# Patient Record
Sex: Female | Born: 1935 | Marital: Married | Smoking: Never smoker
Health system: Southern US, Community
[De-identification: ages and names within clinical notes are randomized; demographics above are authoritative.]

## PROBLEM LIST (undated history)

## (undated) DIAGNOSIS — I10 Essential (primary) hypertension: Secondary | ICD-10-CM

## (undated) HISTORY — DX: Essential (primary) hypertension: I10

---

## 2014-04-24 ENCOUNTER — Ambulatory Visit (INDEPENDENT_AMBULATORY_CARE_PROVIDER_SITE_OTHER): Payer: Self-pay

## 2014-04-24 ENCOUNTER — Ambulatory Visit (INDEPENDENT_AMBULATORY_CARE_PROVIDER_SITE_OTHER): Payer: Self-pay | Admitting: Family Medicine

## 2014-04-24 VITALS — BP 176/92 | HR 95 | Temp 99.6°F | Resp 16 | Ht <= 58 in | Wt 113.0 lb

## 2014-04-24 DIAGNOSIS — I10 Essential (primary) hypertension: Secondary | ICD-10-CM | POA: Insufficient documentation

## 2014-04-24 DIAGNOSIS — R109 Unspecified abdominal pain: Secondary | ICD-10-CM

## 2014-04-24 DIAGNOSIS — Z789 Other specified health status: Secondary | ICD-10-CM

## 2014-04-24 LAB — POCT URINALYSIS DIPSTICK
Bilirubin, UA: NEGATIVE
GLUCOSE UA: NEGATIVE
Ketones, UA: 40
NITRITE UA: NEGATIVE
Protein, UA: 30
Spec Grav, UA: 1.015
UROBILINOGEN UA: 0.2
pH, UA: 5.5

## 2014-04-24 LAB — POCT UA - MICROSCOPIC ONLY
Casts, Ur, LPF, POC: NEGATIVE
Crystals, Ur, HPF, POC: NEGATIVE
Mucus, UA: NEGATIVE
YEAST UA: NEGATIVE

## 2014-04-24 MED ORDER — ACETAMINOPHEN-CODEINE #3 300-30 MG PO TABS: 1.0000 | ORAL_TABLET | Freq: Four times a day (QID) | ORAL | Status: AC | PRN

## 2014-04-24 MED ORDER — ENALAPRIL MALEATE 5 MG PO TABS: 5.0000 mg | ORAL_TABLET | Freq: Every day | ORAL | Status: AC

## 2014-04-24 NOTE — Patient Instructions (Signed)
I will be in touch with your labs soon Use the enalapril 5mg  for your high blood pressure You can use some advil, but if this does not control your pain use the tylenol with codeine. This will make you a bit sleepy however!  Be sure to take deep breaths several times a day to help prevent pneumonia.  Let me know if you are not feeling better in the next few days- Sooner if worse.   If you are feeling much worse or in any distress go right to the ER!
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
